# Patient Record
Sex: Male | Born: 1970 | Hispanic: No | Marital: Married | Smoking: Never smoker
Health system: Southern US, Community
[De-identification: ages and names within clinical notes are randomized; demographics above are authoritative.]

## PROBLEM LIST (undated history)

## (undated) HISTORY — PX: THYROIDECTOMY: SHX17

---

## 2013-07-30 ENCOUNTER — Encounter (HOSPITAL_COMMUNITY): Payer: Self-pay | Admitting: Emergency Medicine

## 2013-07-30 ENCOUNTER — Emergency Department (HOSPITAL_COMMUNITY)
Admission: EM | Admit: 2013-07-30 | Discharge: 2013-07-30 | Disposition: A | Discharge status: Home / Self Care | Payer: Self-pay | Attending: Emergency Medicine | Admitting: Emergency Medicine

## 2013-07-30 ENCOUNTER — Emergency Department (HOSPITAL_COMMUNITY): Payer: Self-pay

## 2013-07-30 DIAGNOSIS — N201 Calculus of ureter: Secondary | ICD-10-CM | POA: Insufficient documentation

## 2013-07-30 LAB — CBC WITH DIFFERENTIAL/PLATELET
BASOS ABS: 0.1 10*3/uL (ref 0.0–0.1)
Basophils Relative: 1 % (ref 0–1)
EOS ABS: 0.2 10*3/uL (ref 0.0–0.7)
EOS PCT: 3 % (ref 0–5)
HCT: 41.3 % (ref 39.0–52.0)
Hemoglobin: 14.3 g/dL (ref 13.0–17.0)
LYMPHS PCT: 38 % (ref 12–46)
Lymphs Abs: 3.1 10*3/uL (ref 0.7–4.0)
MCH: 29.1 pg (ref 26.0–34.0)
MCHC: 34.6 g/dL (ref 30.0–36.0)
MCV: 84.1 fL (ref 78.0–100.0)
Monocytes Absolute: 0.6 10*3/uL (ref 0.1–1.0)
Monocytes Relative: 8 % (ref 3–12)
Neutro Abs: 4.1 10*3/uL (ref 1.7–7.7)
Neutrophils Relative %: 50 % (ref 43–77)
Platelets: 281 10*3/uL (ref 150–400)
RBC: 4.91 MIL/uL (ref 4.22–5.81)
RDW: 12.8 % (ref 11.5–15.5)
WBC: 8.1 10*3/uL (ref 4.0–10.5)

## 2013-07-30 LAB — URINALYSIS, ROUTINE W REFLEX MICROSCOPIC
Bilirubin Urine: NEGATIVE
Glucose, UA: NEGATIVE mg/dL
Ketones, ur: NEGATIVE mg/dL
LEUKOCYTES UA: NEGATIVE
Nitrite: NEGATIVE
PROTEIN: NEGATIVE mg/dL
Specific Gravity, Urine: 1.015 (ref 1.005–1.030)
Urobilinogen, UA: 0.2 mg/dL (ref 0.0–1.0)
pH: 7 (ref 5.0–8.0)

## 2013-07-30 LAB — COMPREHENSIVE METABOLIC PANEL
ALT: 22 U/L (ref 0–53)
AST: 39 U/L — AB (ref 0–37)
Albumin: 4.7 g/dL (ref 3.5–5.2)
Alkaline Phosphatase: 73 U/L (ref 39–117)
BUN: 14 mg/dL (ref 6–23)
CALCIUM: 9.8 mg/dL (ref 8.4–10.5)
CO2: 18 meq/L — AB (ref 19–32)
Chloride: 96 mEq/L (ref 96–112)
Creatinine, Ser: 0.86 mg/dL (ref 0.50–1.35)
GFR calc Af Amer: >90 mL/min (ref >90)
GFR calc non Af Amer: >90 mL/min (ref >90)
Glucose, Bld: 153 mg/dL — ABNORMAL HIGH (ref 70–99)
Potassium: 4 mEq/L (ref 3.7–5.3)
Sodium: 138 mEq/L (ref 137–147)
TOTAL PROTEIN: 8 g/dL (ref 6.0–8.3)
Total Bilirubin: 0.5 mg/dL (ref 0.3–1.2)

## 2013-07-30 LAB — URINE MICROSCOPIC-ADD ON

## 2013-07-30 MED ORDER — PROMETHAZINE HCL 25 MG PO TABS: 25.0000 mg | ORAL_TABLET | Freq: Three times a day (TID) | ORAL | Status: AC | PRN

## 2013-07-30 MED ORDER — PERCOCET 5-325 MG PO TABS: 1.0000 | ORAL_TABLET | Freq: Four times a day (QID) | ORAL | Status: AC | PRN

## 2013-07-30 MED ORDER — HYDROMORPHONE HCL PF 1 MG/ML IJ SOLN
1.0000 mg | Freq: Once | INTRAMUSCULAR | Status: AC
  Administered 2013-07-30: 1 mg via INTRAVENOUS
  Filled 2013-07-30: qty 1

## 2013-07-30 MED ORDER — ONDANSETRON HCL 4 MG/2ML IJ SOLN
4.0000 mg | Freq: Once | INTRAMUSCULAR | Status: AC
  Administered 2013-07-30: 4 mg via INTRAVENOUS
  Filled 2013-07-30: qty 2

## 2013-07-30 MED ORDER — KETOROLAC TROMETHAMINE 30 MG/ML IJ SOLN
30.0000 mg | Freq: Once | INTRAMUSCULAR | Status: AC
  Administered 2013-07-30: 30 mg via INTRAVENOUS
  Filled 2013-07-30: qty 1

## 2013-07-30 MED ORDER — SODIUM CHLORIDE 0.9 % IV BOLUS (SEPSIS)
1000.0000 mL | Freq: Once | INTRAVENOUS | Status: AC
  Administered 2013-07-30: 1000 mL via INTRAVENOUS

## 2013-07-30 MED ORDER — TAMSULOSIN HCL 0.4 MG PO CAPS: 0.4000 mg | ORAL_CAPSULE | Freq: Every day | ORAL | Status: AC

## 2013-07-30 NOTE — Progress Notes (Signed)
[facility] provided pt with a list of primary care resources and a [facility] Orange Card application to help patient establish primary care.  °

## 2013-07-30 NOTE — ED Provider Notes (Signed)
CSN: [id number]     Arrival date & time 07/30/13  [id number] History   First MD Initiated Contact with Patient 07/30/13 [phone]     Chief Complaint  Patient presents with  . Flank Pain     (Consider location/radiation/quality/duration/timing/severity/associated sxs/prior Treatment) HPI Patient presents to the emergency department with sudden onset of right flank pain at 4 am.  The patient, states, that he has never had a history of kidney stones.  The patient denies taking any medications prior to arrival.  The patient denies chest pain, shortness breath, vomiting, diarrhea abdominal distention, weakness, dizziness, fever, rash, or syncope.  The patient, states, that he is having a little bit of difficulty urinating.  Patient, states, that seems to make his condition, better or worse History reviewed. No pertinent past medical history. Past Surgical History  Procedure Laterality Date  . Thyroidectomy     No family history on file. History  Substance Use Topics  . Smoking status: Never Smoker   . Smokeless tobacco: Not on file  . Alcohol Use: No    Review of Systems All other systems negative except as documented in the HPI. All pertinent positives and negatives as reviewed in the HPI.    Allergies  Review of patient's allergies indicates no known allergies.  Home Medications   Prior to Admission medications   Not on File   BP 143/82  Pulse 82  Temp(Src) 98.4 F (36.9 C) (Oral)  Resp 20  SpO2 100% Physical Exam  Nursing note and vitals reviewed. Constitutional: He is oriented to person, place, and time. He appears well-developed and well-nourished.  HENT:  Head: Normocephalic and atraumatic.  Mouth/Throat: Oropharynx is clear and moist.  Eyes: Pupils are equal, round, and reactive to light.  Neck: Normal range of motion. Neck supple.  Cardiovascular: Normal rate, regular rhythm and normal heart sounds.  Exam reveals no gallop and no friction rub.   No murmur  heard. Pulmonary/Chest: Effort normal and breath sounds normal. No respiratory distress.  Abdominal: Soft. Bowel sounds are normal. He exhibits no distension. There is tenderness. There is no rebound and no guarding.  Neurological: He is alert and oriented to person, place, and time. He exhibits normal muscle tone. Coordination normal.  Skin: Skin is warm and dry.    ED Course  Procedures (including critical care time) Labs Review Labs Reviewed  URINALYSIS, ROUTINE W REFLEX MICROSCOPIC - Abnormal; Notable for the following:    Hgb urine dipstick MODERATE (*)    All other components within normal limits  COMPREHENSIVE METABOLIC PANEL - Abnormal; Notable for the following:    CO2 18 (*)    Glucose, Bld 153 (*)    AST 39 (*)    All other components within normal limits  CBC WITH DIFFERENTIAL  URINE MICROSCOPIC-ADD ON    Imaging Review Ct Abdomen Pelvis Wo Contrast  07/30/2013   CLINICAL DATA:  Right flank pain following  EXAM: CT ABDOMEN AND PELVIS WITHOUT CONTRAST  TECHNIQUE: Multidetector CT imaging of the abdomen and pelvis was performed following the standard protocol without intravenous contrast.  COMPARISON:  None.  FINDINGS: Lung bases are unremarkable. Unenhanced liver, pancreas, spleen and adrenal glands are unremarkable. Unenhanced kidneys shows no nephrolithiasis. There is very mild right hydronephrosis and minimal distension of the right ureter. Subtle my right perinephric stranding. No aortic aneurysm. No small bowel obstruction. No pericecal inflammation. The terminal ileum is unremarkable.  In axial image 77 there is poorly visualized calculus in distal right ureter about  5 mm from right UVJ measures 2 mm. This is confirmed on coronal image 56. Distal left ureter is unremarkable. Urinary bladder is under distended without evidence of calcified calculi. No ascites or free air. No adenopathy.  Sagittal images of the spine are unremarkable.  IMPRESSION: 1. There is mild right  hydronephrosis and minimal distension of right ureter. Poorly visualized 2 mm calculus in distal right ureter about 5 mm from right UVJ. 2. No nephrolithiasis. 3. Unremarkable left ureter. No small bowel obstruction. No ascites or free air. 4. No pericecal inflammation.   Electronically Signed   By: Natasha MeadLiviu  Pop M.D.   On: 07/30/2013 08:38   Patient be treated for a 2 mm right UVJ stone.  Patient, at this time is feeling better on reassessment, and is pain-free.  The patient is advised of the plan.  All questions were answered.  The patient  voices an understanding.  Told to return here for any worsening of pain, or difficulty urinating or fever    Carlyle DollyChristopher W Briston Lax, PA-C 07/30/13 1108

## 2013-07-30 NOTE — Discharge Instructions (Signed)
Return here for any worsening in your condition. Follow up with the Urologist provided. Increase your fluid intake.

## 2013-07-30 NOTE — ED Notes (Signed)
Pt sleeping when walked into room. Reminded pt we still needed a urine specimen, left urinal at bedside, and instructed to call out as soon as he was able too.

## 2013-07-30 NOTE — ED Notes (Signed)
Pt unable to urinate.

## 2013-07-30 NOTE — ED Provider Notes (Signed)
Medical screening examination/treatment/procedure(s) were conducted as a shared visit with non-physician practitioner(s) and myself.  I personally evaluated the patient during the encounter.   EKG Interpretation None      I interviewed and examined the patient. Lungs are CTAB. Cardiac exam wnl. Abdomen soft.    Junius ArgyleForrest S Braeden Kennan, MD 07/30/13 2107

## 2013-07-30 NOTE — ED Notes (Signed)
Pt c/o right flank pain x 2 hours; nauseated

## 2014-11-19 ENCOUNTER — Emergency Department (HOSPITAL_COMMUNITY)
Admission: EM | Admit: 2014-11-19 | Discharge: 2014-11-19 | Disposition: A | Discharge status: Home / Self Care | Payer: Self-pay | Attending: Emergency Medicine | Admitting: Emergency Medicine

## 2014-11-19 ENCOUNTER — Encounter (HOSPITAL_COMMUNITY): Payer: Self-pay | Admitting: Emergency Medicine

## 2014-11-19 DIAGNOSIS — K279 Peptic ulcer, site unspecified, unspecified as acute or chronic, without hemorrhage or perforation: Secondary | ICD-10-CM | POA: Insufficient documentation

## 2014-11-19 DIAGNOSIS — R109 Unspecified abdominal pain: Secondary | ICD-10-CM

## 2014-11-19 LAB — CBC WITH DIFFERENTIAL/PLATELET
BASOS PCT: 0 % (ref 0–1)
Basophils Absolute: 0 10*3/uL (ref 0.0–0.1)
EOS ABS: 0.3 10*3/uL (ref 0.0–0.7)
EOS PCT: 4 % (ref 0–5)
HCT: 41.5 % (ref 39.0–52.0)
HEMOGLOBIN: 14.4 g/dL (ref 13.0–17.0)
LYMPHS ABS: 2.5 10*3/uL (ref 0.7–4.0)
Lymphocytes Relative: 35 % (ref 12–46)
MCH: 29.6 pg (ref 26.0–34.0)
MCHC: 34.7 g/dL (ref 30.0–36.0)
MCV: 85.2 fL (ref 78.0–100.0)
MONO ABS: 0.5 10*3/uL (ref 0.1–1.0)
MONOS PCT: 6 % (ref 3–12)
Neutro Abs: 3.9 10*3/uL (ref 1.7–7.7)
Neutrophils Relative %: 55 % (ref 43–77)
Platelets: 277 10*3/uL (ref 150–400)
RBC: 4.87 MIL/uL (ref 4.22–5.81)
RDW: 13.3 % (ref 11.5–15.5)
WBC: 7 10*3/uL (ref 4.0–10.5)

## 2014-11-19 LAB — URINALYSIS W MICROSCOPIC (NOT AT ARMC)
Bilirubin Urine: NEGATIVE
Glucose, UA: NEGATIVE mg/dL
HGB URINE DIPSTICK: NEGATIVE
Ketones, ur: NEGATIVE mg/dL
LEUKOCYTES UA: NEGATIVE
Nitrite: NEGATIVE
PH: 5.5 (ref 5.0–8.0)
PROTEIN: NEGATIVE mg/dL
Specific Gravity, Urine: 1.013 (ref 1.005–1.030)
Urobilinogen, UA: 0.2 mg/dL (ref 0.0–1.0)

## 2014-11-19 LAB — BASIC METABOLIC PANEL
Anion gap: 9 (ref 5–15)
BUN: 10 mg/dL (ref 6–20)
CO2: 26 mmol/L (ref 22–32)
Calcium: 9.4 mg/dL (ref 8.9–10.3)
Chloride: 102 mmol/L (ref 101–111)
Creatinine, Ser: 0.91 mg/dL (ref 0.61–1.24)
GFR calc Af Amer: >60 mL/min (ref >60)
Glucose, Bld: 106 mg/dL — ABNORMAL HIGH (ref 65–99)
POTASSIUM: 4.1 mmol/L (ref 3.5–5.1)
SODIUM: 137 mmol/L (ref 135–145)

## 2014-11-19 LAB — HEPATIC FUNCTION PANEL
ALT: 29 U/L (ref 17–63)
AST: 42 U/L — ABNORMAL HIGH (ref 15–41)
Albumin: 4.5 g/dL (ref 3.5–5.0)
Alkaline Phosphatase: 72 U/L (ref 38–126)
BILIRUBIN DIRECT: 0.4 mg/dL (ref 0.1–0.5)
BILIRUBIN INDIRECT: 0.7 mg/dL (ref 0.3–0.9)
Total Bilirubin: 1.1 mg/dL (ref 0.3–1.2)
Total Protein: 7.7 g/dL (ref 6.5–8.1)

## 2014-11-19 LAB — LIPASE, BLOOD: Lipase: 22 U/L (ref 22–51)

## 2014-11-19 MED ORDER — FAMOTIDINE IN NACL 20-0.9 MG/50ML-% IV SOLN
20.0000 mg | Freq: Once | INTRAVENOUS | Status: AC
  Administered 2014-11-19: 20 mg via INTRAVENOUS
  Filled 2014-11-19: qty 50

## 2014-11-19 MED ORDER — OMEPRAZOLE 20 MG PO CPDR: 20.0000 mg | DELAYED_RELEASE_CAPSULE | Freq: Every day | ORAL | Status: DC

## 2014-11-19 NOTE — Discharge Instructions (Signed)
Abdominal Pain Many things can cause belly (abdominal) pain. Most times, the belly pain is not dangerous. Many cases of belly pain can be watched and treated at home. HOME CARE   Do not take medicines that help you go poop (laxatives) unless told to by your doctor.  Only take medicine as told by your doctor.  Eat or drink as told by your doctor. Your doctor will tell you if you should be on a special diet. GET HELP IF:  You do not know what is causing your belly pain.  You have belly pain while you are sick to your stomach (nauseous) or have runny poop (diarrhea).  You have pain while you pee or poop.  Your belly pain wakes you up at night.  You have belly pain that gets worse or better when you eat.  You have belly pain that gets worse when you eat fatty foods.  You have a fever. GET HELP RIGHT AWAY IF:   The pain does not go away within 2 hours.  You keep throwing up (vomiting).  The pain changes and is only in the right or left part of the belly.  You have bloody or tarry looking poop. MAKE SURE YOU:   Understand these instructions.  Will watch your condition.  Will get help right away if you are not doing well or get worse. Document Released: 09/12/2007 Document Revised: 03/31/2013 Document Reviewed: 12/03/2012 [facility] Patient Information 2015 [place], [place]. This information is not intended to replace advice given to you by your health care provider. Make sure you discuss any questions you have with your health care provider.  Peptic Ulcer A peptic ulcer is a painful sore in the lining of your esophagus, stomach, or in the first part of your small intestine. The main causes of an ulcer can be:  An infection.  Using certain pain medicines too often or too much.  Smoking. HOME CARE  Avoid smoking, alcohol, and caffeine.  Avoid foods that bother you.  Only take medicine as told by your doctor. Do not take any medicines your doctor has not approved.  Keep  all doctor visits as told. GET HELP IF:  You do not get better in 7 days after starting treatment.  You keep having an upset stomach (indigestion) or heartburn. GET HELP RIGHT AWAY IF:  You have sudden, sharp, or lasting belly (abdominal) pain.  You have bloody, black, or tarry poop (stool).  You throw up (vomit) blood or your throw up looks like coffee grounds.  You get light-headed, weak, or feel like you will pass out (faint).  You get sweaty or feel sticky and cold to the touch (clammy). MAKE SURE YOU:   Understand these instructions.  Will watch your condition.  Will get help right away if you are not doing well or get worse. Document Released: 06/20/2009 Document Revised: 08/10/2013 Document Reviewed: 10/24/2011 [facility] Patient Information 2015 [place], [place]. This information is not intended to replace advice given to you by your health care provider. Make sure you discuss any questions you have with your health care provider.

## 2014-11-19 NOTE — ED Notes (Signed)
Pt reports lower left quadrant pain x 2 weeks with some nausea, denies vomiting or diarrhea. Pt also reports pain in his left shoulder.

## 2014-11-19 NOTE — ED Notes (Signed)
MD at bedside. 

## 2014-11-19 NOTE — ED Provider Notes (Signed)
CSN: [id number]     Arrival date & time 11/19/14  1029 History   First MD Initiated Contact with Patient 11/19/14 1038     Chief Complaint  Patient presents with  . Abdominal Pain     (Consider location/radiation/quality/duration/timing/severity/associated sxs/prior Treatment) HPI  44 year old male, otherwise healthy with no prior abdominal surgeries, who presents to emergency department with abdominal pain. He reports 2 weeks of intermittent left-sided abdominal pain associated with eating. He notes that pain is worse when he drinks coffee. Pain usually resolves on its own in about an hour or so. It is sometimes associated with nausea but denies any vomiting, melena, hematochezia, weight loss, or early satiety. He has not had any fevers, chills, diarrhea, dysuria, urinary frequency, hematuria, urinary symptoms, testicular symptoms.  History reviewed. No pertinent past medical history. Past Surgical History  Procedure Laterality Date  . Thyroidectomy     History reviewed. No pertinent family history. Social History  Substance Use Topics  . Smoking status: Never Smoker   . Smokeless tobacco: None  . Alcohol Use: No    Review of Systems 10/14 systems reviewed and are negative other than those stated in the HPI   Allergies  Review of patient's allergies indicates no known allergies.  Home Medications   Prior to Admission medications   Medication Sig Start Date End Date Taking? Authorizing Provider  omeprazole (PRILOSEC) 20 MG capsule Take 1 capsule (20 mg total) by mouth daily. 11/19/14   Lavera Guise, MD  PERCOCET 5-325 MG per tablet Take 1 tablet by mouth every 6 (six) hours as needed for severe pain. Patient not taking: Reported on 11/19/2014 07/30/13   Charlestine Night, PA-C  promethazine (PHENERGAN) 25 MG tablet Take 1 tablet (25 mg total) by mouth every 8 (eight) hours as needed for nausea or vomiting. Patient not taking: Reported on 11/19/2014 07/30/13   Charlestine Night,  PA-C  tamsulosin (FLOMAX) 0.4 MG CAPS capsule Take 1 capsule (0.4 mg total) by mouth daily. Patient not taking: Reported on 11/19/2014 07/30/13   Charlestine Night, PA-C   BP 126/84 mmHg  Pulse 67  Temp(Src) 98.1 F (36.7 C) (Oral)  Resp 12  SpO2 98% Physical Exam Physical Exam  Nursing note and vitals reviewed. Constitutional: Well developed, well nourished, non-toxic, and in no acute distress Head: Normocephalic and atraumatic.  Mouth/Throat: Oropharynx is clear and moist.  Neck: Normal range of motion. Neck supple.  Cardiovascular: Normal rate and regular rhythm.   Pulmonary/Chest: Effort normal and breath sounds normal.  Abdominal: Soft. There is no tenderness. There is no rebound and no guarding.  Musculoskeletal: Normal range of motion.  Neurological: Alert, no facial droop, fluent speech, moves all extremities symmetrically Skin: Skin is warm and dry.  Psychiatric: Cooperative  ED Course  Procedures (including critical care time) Labs Review Labs Reviewed  BASIC METABOLIC PANEL - Abnormal; Notable for the following:    Glucose, Bld 106 (*)    All other components within normal limits  HEPATIC FUNCTION PANEL - Abnormal; Notable for the following:    AST 42 (*)    All other components within normal limits  CBC WITH DIFFERENTIAL/PLATELET  URINALYSIS W MICROSCOPIC  LIPASE, BLOOD    Imaging Review No results found. I, Lavera Guise, personally reviewed and evaluated these images and lab results as part of my medical decision-making.   EKG Interpretation None      MDM   Final diagnoses:  Abdominal pain, unspecified abdominal location  Peptic ulcer  In short, this is a 44 year old male, otherwise healthy with no previous abdominal surgeries who presents with intermittent left-sided abdominal pain after eating and drinking coffee. He is nontoxic in no acute distress on arrival. His vital signs are within normal limits. He currently has no abdominal pain since  arrival. He has a soft and benign abdomen. Overall symptoms seem consistent with possible peptic ulcer disease. Basic blood work including CBC, comprehensive metabolic panel, lipase and urinalysis will be checked. He'll be given Pepcid here. Given normal abdominal exam, low suspicion for serious or toxic intraabdominal processes at this time.  He will be discharged home with a PPI and GI follow-up.    Lavera Guise, MD 11/19/14 [phone]

## 2015-04-29 IMAGING — CT CT ABD-PELV W/O CM
1 series · 10 of 12 positions shown, 16 images · non-contrast
Comparison: None.

CLINICAL DATA: Right flank pain following

EXAM:
CT ABDOMEN AND PELVIS WITHOUT CONTRAST
TECHNIQUE: Multidetector CT imaging of the abdomen and pelvis was performed
following the standard protocol without intravenous contrast.

[Series 6: lung · axial · 0.78mm/px · z∈[-75,-30]mm · 10 of 12 slices shown, 16 images]
[im 2/12  soft-tissue]
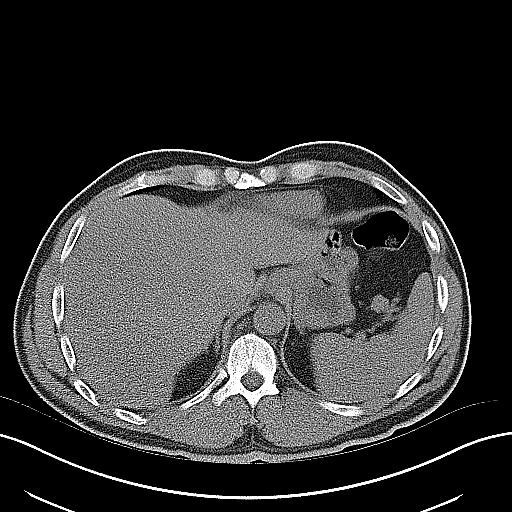
[im 2/12  bone]
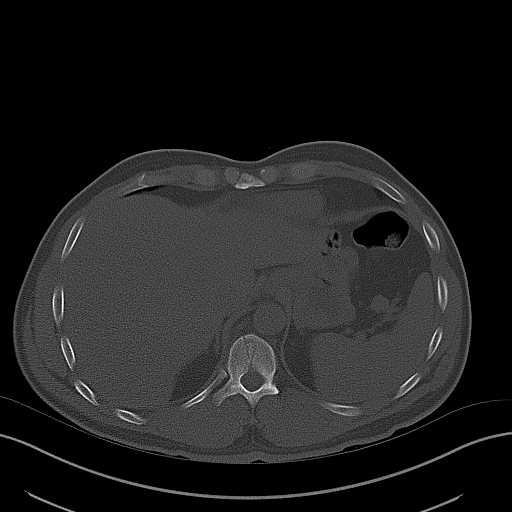
[im 3/12  soft-tissue]
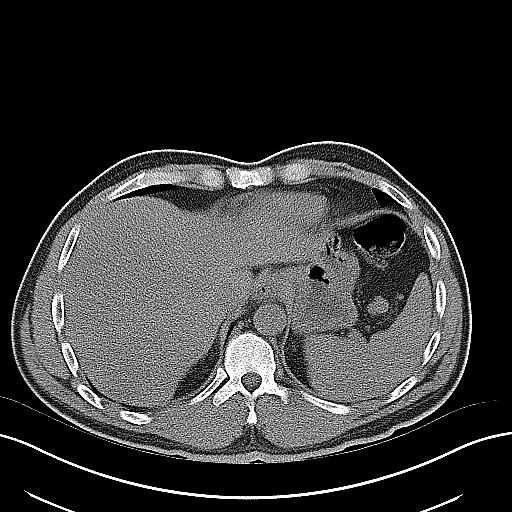
[im 4/12  soft-tissue]
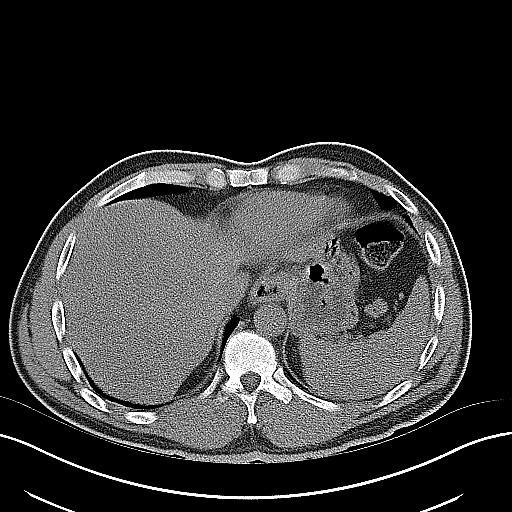
[im 5/12  soft-tissue]
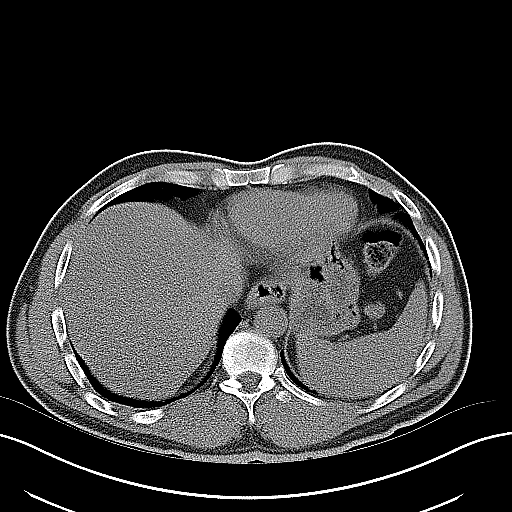
[im 6/12  soft-tissue]
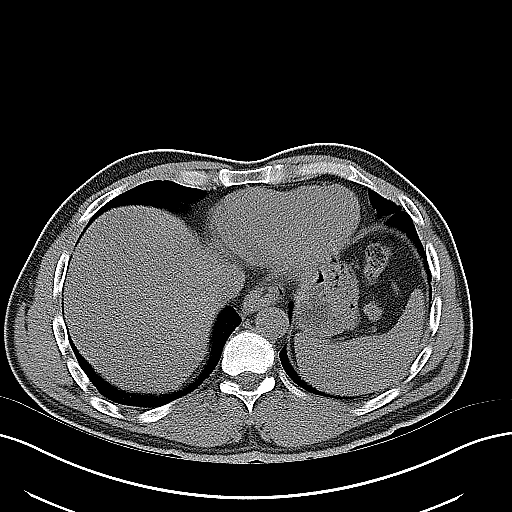
[im 7/12  soft-tissue]
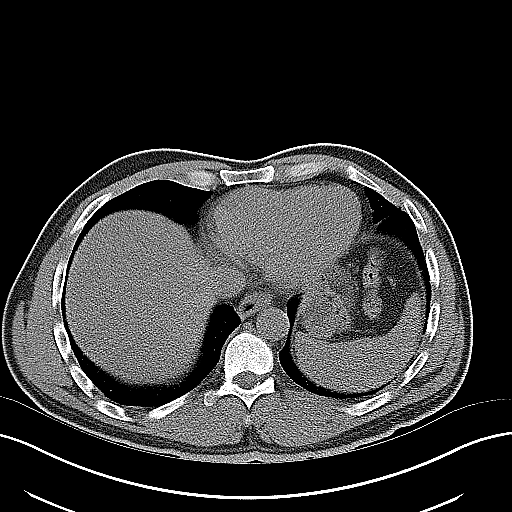
[im 8/12  soft-tissue]
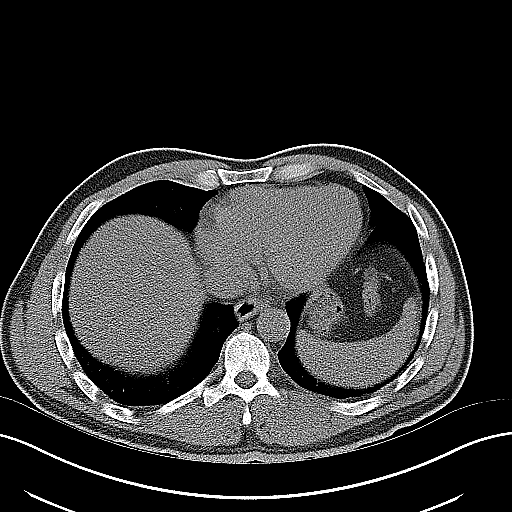
[im 8/12  lung]
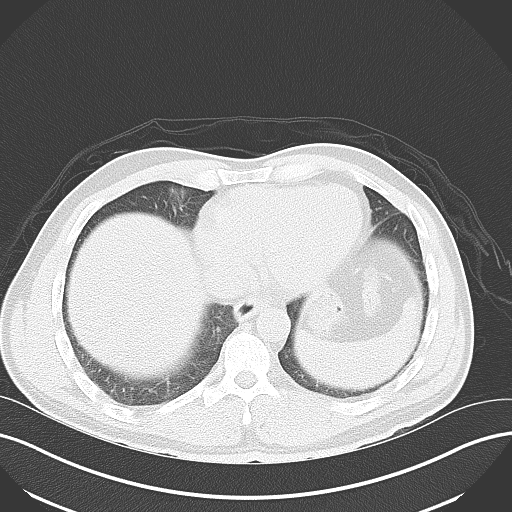
[im 9/12  soft-tissue]
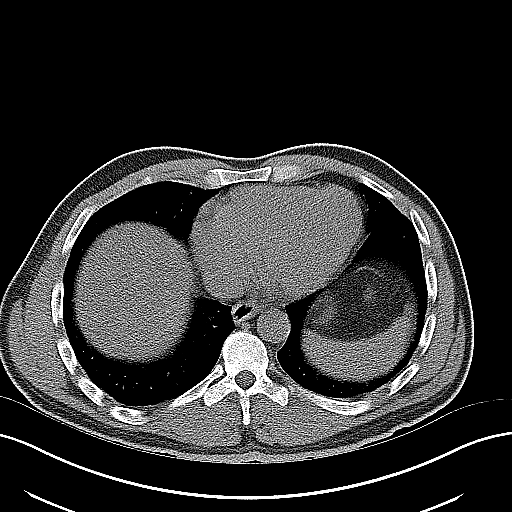
[im 9/12  lung]
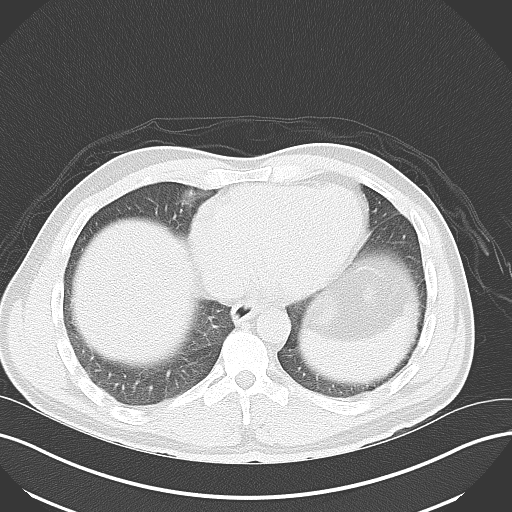
[im 10/12  soft-tissue]
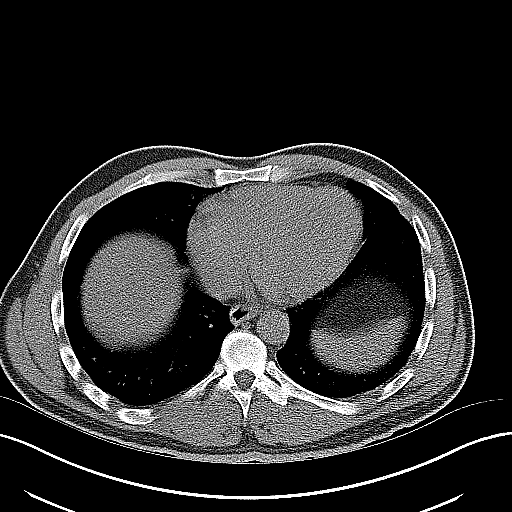
[im 10/12  lung]
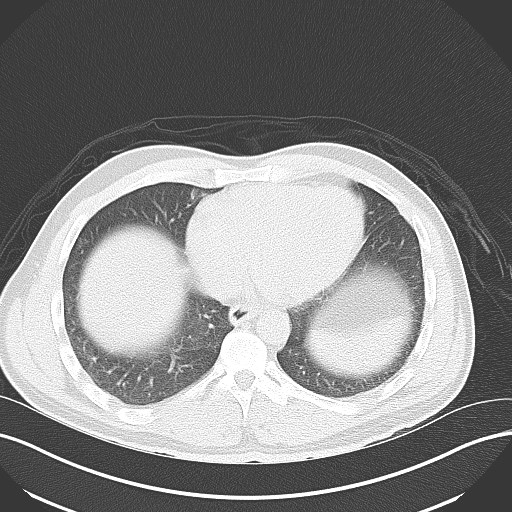
[im 10/12  bone]
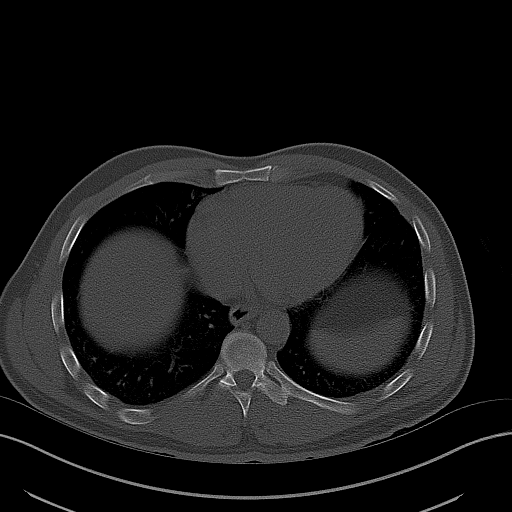
[im 11/12  soft-tissue]
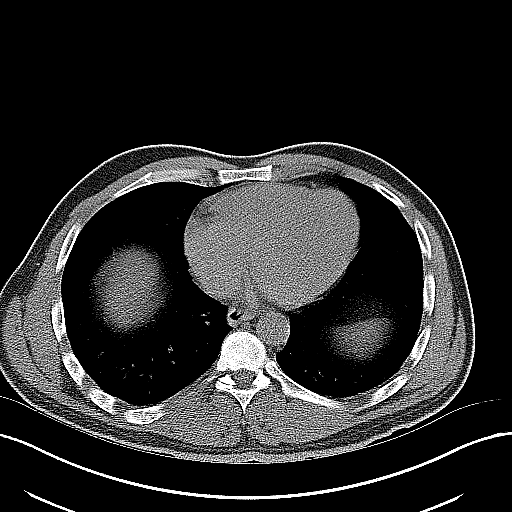
[im 11/12  lung]
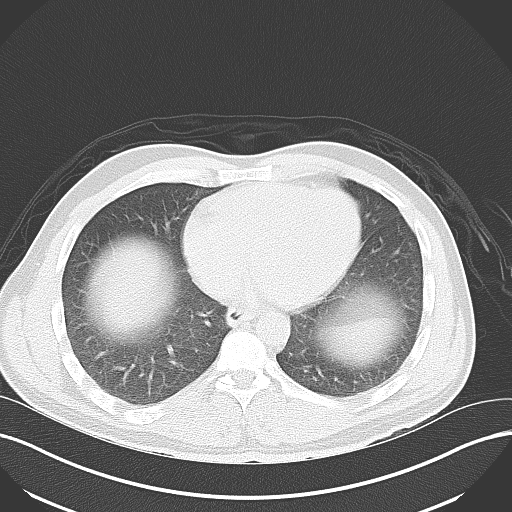

[10 of 12 positions shown; findings below may reference images not displayed]

FINDINGS: Lung bases are unremarkable. Unenhanced liver, pancreas, spleen and
adrenal glands are unremarkable. Unenhanced kidneys shows no
nephrolithiasis. There is very mild right hydronephrosis and minimal
distension of the right ureter. Subtle my right perinephric
stranding. No aortic aneurysm. No small bowel obstruction. No
pericecal inflammation. The terminal ileum is unremarkable.

In axial image 77 there is poorly visualized calculus in distal
right ureter about 5 mm from right UVJ measures 2 mm. This is
confirmed on coronal image 56. Distal left ureter is unremarkable.
Urinary bladder is under distended without evidence of calcified
calculi. No ascites or free air. No adenopathy.

Sagittal images of the spine are unremarkable.
IMPRESSION: 1. There is mild right hydronephrosis and minimal distension of
right ureter. Poorly visualized 2 mm calculus in distal right ureter
about 5 mm from right UVJ.
2. No nephrolithiasis.
3. Unremarkable left ureter. No small bowel obstruction. No ascites
or free air.
4. No pericecal inflammation.

## 2015-11-21 ENCOUNTER — Ambulatory Visit: Payer: Self-pay

## 2015-11-21 ENCOUNTER — Other Ambulatory Visit: Payer: Self-pay | Admitting: Occupational Medicine

## 2015-11-21 DIAGNOSIS — M79631 Pain in right forearm: Secondary | ICD-10-CM

## 2015-12-13 ENCOUNTER — Emergency Department (HOSPITAL_COMMUNITY)
Admission: EM | Admit: 2015-12-13 | Discharge: 2015-12-14 | Disposition: A | Discharge status: Home / Self Care | Payer: 59 | Attending: Emergency Medicine | Admitting: Emergency Medicine

## 2015-12-13 ENCOUNTER — Encounter (HOSPITAL_COMMUNITY): Payer: Self-pay | Admitting: *Deleted

## 2015-12-13 DIAGNOSIS — H5713 Ocular pain, bilateral: Secondary | ICD-10-CM | POA: Diagnosis present

## 2015-12-13 DIAGNOSIS — H16133 Photokeratitis, bilateral: Secondary | ICD-10-CM | POA: Diagnosis not present

## 2015-12-13 MED ORDER — TETRACAINE HCL 0.5 % OP SOLN
1.0000 [drp] | Freq: Once | OPHTHALMIC | Status: AC
  Administered 2015-12-13: 1 [drp] via OPHTHALMIC
  Filled 2015-12-13: qty 2

## 2015-12-13 MED ORDER — ERYTHROMYCIN 5 MG/GM OP OINT: TOPICAL_OINTMENT | OPHTHALMIC | 0 refills | Status: DC

## 2015-12-13 MED ORDER — FLUORESCEIN SODIUM 1 MG OP STRP
2.0000 | ORAL_STRIP | Freq: Once | OPHTHALMIC | Status: AC
  Administered 2015-12-13: 2 via OPHTHALMIC
  Filled 2015-12-13: qty 2

## 2015-12-13 NOTE — Discharge Instructions (Signed)
Follow up with ophthalmology tomorrow or as soon as possible. Use antibiotic ointment as instructed.

## 2015-12-13 NOTE — ED Provider Notes (Signed)
[facility]-EMERGENCY DEPT Provider Note   CSN: [id number] Arrival date & time: 12/13/15  2301  By signing my name below, I, Bridgette Habermann, attest that this documentation has been prepared under the direction and in the presence of Chadwin Fury, [place]. Electronically Signed: Bridgette Habermann, ED Scribe. 12/13/15. 11:30 PM.  History   Chief Complaint Chief Complaint  Patient presents with  . Eye Problem   HPI Comments: Ricky Huber is a 45 y.o. male who presents to the Emergency Department complaining of gradual onset, constant, burning, bilateral eye pain with redness onset 4 pm today. Pt also has associated blurry vision and copious eye tearing. Denies foreign body sensation. Pt works in Production designer, theatre/television/film and states he was working next to [employer] earlier today. States he was wearing clear safety goggles but no UV specific eyewear. He denies any modifying factors. Pt denies any exposure to chemicals. No treatments tried. Pt wears contacts. Pt endorses photophobia with direct light, though not to ambient light.  The history is provided by the patient. No language interpreter was used.    History reviewed. No pertinent past medical history.  There are no active problems to display for this patient.   Past Surgical History:  Procedure Laterality Date  . THYROIDECTOMY         Home Medications    Prior to Admission medications   Medication Sig Start Date End Date Taking? Authorizing Provider  omeprazole (PRILOSEC) 20 MG capsule Take 1 capsule (20 mg total) by mouth daily. 11/19/14   Lavera Guise, MD  PERCOCET 5-325 MG per tablet Take 1 tablet by mouth every 6 (six) hours as needed for severe pain. Patient not taking: Reported on 11/19/2014 07/30/13   Charlestine Night, PA-C  promethazine (PHENERGAN) 25 MG tablet Take 1 tablet (25 mg total) by mouth every 8 (eight) hours as needed for nausea or vomiting. Patient not taking: Reported on 11/19/2014 07/30/13   Charlestine Night, PA-C  tamsulosin (FLOMAX) 0.4 MG  CAPS capsule Take 1 capsule (0.4 mg total) by mouth daily. Patient not taking: Reported on 11/19/2014 07/30/13   Charlestine Night, PA-C    Family History History reviewed. No pertinent family history.  Social History Social History  Substance Use Topics  . Smoking status: Never Smoker  . Smokeless tobacco: Never Used  . Alcohol use No     Allergies   Review of patient's allergies indicates no known allergies.   Review of Systems Review of Systems 10 Systems reviewed and all are negative for acute change except as noted in the HPI. Physical Exam Updated Vital Signs BP 148/72 (BP Location: Left Arm)   Pulse 77   Temp 98.2 F (36.8 C) (Oral)   Resp 20   Ht 6' (1.829 m)   Wt 193 lb (87.5 kg)   SpO2 98%   BMI 26.18 kg/m   Physical Exam  Constitutional: He appears well-developed and well-nourished.  HENT:  Head: Normocephalic.  Eyes: Conjunctivae and EOM are normal. Pupils are equal, round, and reactive to light.  Bilateral conjunctival injection, R>L. No fluorescence uptake on exam. Bilateral copious eye watering.  Cardiovascular: Normal rate.   Pulmonary/Chest: Effort normal. No respiratory distress.  Abdominal: He exhibits no distension.  Musculoskeletal: Normal range of motion.  Neurological: He is alert.  Skin: Skin is warm and dry.  Psychiatric: He has a normal mood and affect. His behavior is normal.  Nursing note and vitals reviewed.   Visual Acuity  Right Eye Distance:   Left Eye Distance:  Bilateral Distance:    Right Eye Near:   Left Eye Near:    Bilateral Near:      ED Treatments / Results  DIAGNOSTIC STUDIES: Oxygen Saturation is 98% on RA, normal by my interpretation.    COORDINATION OF CARE: 11:30 PM Discussed treatment plan with pt at bedside and pt agreed to plan.  Labs (all labs ordered are listed, but only abnormal results are displayed) Labs Reviewed - No data to display  EKG  EKG Interpretation None       Radiology No  results found.  Procedures Procedures (including critical care time)  Medications Ordered in ED Medications - No data to display   Initial Impression / Assessment and Plan / ED Course  I have reviewed the triage vital signs and the nursing notes.  Pertinent labs & imaging results that were available during my care of the patient were reviewed by me and considered in my medical decision making (see chart for details).  Clinical Course    Suspect UV keratitis. Will cover with course of erythromycin ointment. Instructed close f/u with ophthalmology tomorrow. Encouraged wearing UV protective eyewear in the future. ER return precautions given.  Final Clinical Impressions(s) / ED Diagnoses   Final diagnoses:  UV keratitis, bilateral    New Prescriptions Discharge Medication List as of 12/13/2015 11:52 PM    START taking these medications   Details  erythromycin ophthalmic ointment Place a 1/2 inch ribbon of ointment into the lower eyelid three times daily, Print      I personally performed the services described in this documentation, which was scribed in my presence. The recorded information has been reviewed and is accurate.    Carlene CoriaSerena Y Davonna Ertl, PA-C 12/14/15 1520    Mancel BaleElliott Wentz, MD 12/14/15 [phone]

## 2015-12-13 NOTE — ED Triage Notes (Signed)
Pt c/o bilateral eye pain and redness. Pt presents with burning to both eyes and some blurry vision. Pt states onset occurred around 1600 today. Pt works in maintenance. Pt denies any exposure to chemicals.

## 2017-03-07 ENCOUNTER — Emergency Department (HOSPITAL_COMMUNITY): Payer: 59

## 2017-03-07 ENCOUNTER — Encounter (HOSPITAL_COMMUNITY): Payer: Self-pay

## 2017-03-07 ENCOUNTER — Emergency Department (HOSPITAL_COMMUNITY)
Admission: EM | Admit: 2017-03-07 | Discharge: 2017-03-07 | Disposition: A | Discharge status: Home / Self Care | Payer: 59 | Attending: Emergency Medicine | Admitting: Emergency Medicine

## 2017-03-07 DIAGNOSIS — Z79899 Other long term (current) drug therapy: Secondary | ICD-10-CM | POA: Diagnosis not present

## 2017-03-07 DIAGNOSIS — R0789 Other chest pain: Secondary | ICD-10-CM | POA: Insufficient documentation

## 2017-03-07 DIAGNOSIS — F419 Anxiety disorder, unspecified: Secondary | ICD-10-CM | POA: Diagnosis not present

## 2017-03-07 LAB — I-STAT TROPONIN, ED
TROPONIN I, POC: 0 ng/mL (ref 0.00–0.08)
TROPONIN I, POC: 0 ng/mL (ref 0.00–0.08)
Troponin i, poc: 0 ng/mL (ref 0.00–0.08)

## 2017-03-07 LAB — CBC
HEMATOCRIT: 47.8 % (ref 39.0–52.0)
Hemoglobin: 17 g/dL (ref 13.0–17.0)
MCH: 30.1 pg (ref 26.0–34.0)
MCHC: 35.6 g/dL (ref 30.0–36.0)
MCV: 84.8 fL (ref 78.0–100.0)
Platelets: 284 10*3/uL (ref 150–400)
RBC: 5.64 MIL/uL (ref 4.22–5.81)
RDW: 13 % (ref 11.5–15.5)
WBC: 8.2 10*3/uL (ref 4.0–10.5)

## 2017-03-07 LAB — BASIC METABOLIC PANEL
Anion gap: 8 (ref 5–15)
BUN: 10 mg/dL (ref 6–20)
CHLORIDE: 100 mmol/L — AB (ref 101–111)
CO2: 29 mmol/L (ref 22–32)
CREATININE: 0.82 mg/dL (ref 0.61–1.24)
Calcium: 10 mg/dL (ref 8.9–10.3)
GFR calc Af Amer: >60 mL/min (ref >60)
GFR calc non Af Amer: >60 mL/min (ref >60)
Glucose, Bld: 100 mg/dL — ABNORMAL HIGH (ref 65–99)
POTASSIUM: 3.3 mmol/L — AB (ref 3.5–5.1)
SODIUM: 137 mmol/L (ref 135–145)

## 2017-03-07 LAB — D-DIMER, QUANTITATIVE (NOT AT ARMC)

## 2017-03-07 NOTE — ED Triage Notes (Signed)
Patient complains of left anterior chest tightness x 2 days. No associated symptoms. Mild pain on arrival. Alert and oriented, NAD

## 2017-03-07 NOTE — ED Provider Notes (Signed)
Ricky Huber Provider Note   CSN: [id number] Arrival date & time: 03/07/17  [id number]     History   Chief Complaint Chief Complaint  Patient presents with  . Chest Pain    HPI Ricky Huber is a 46 y.o. male.  46yo M who p/w chest pain.  2 days ago, the patient began had it being intermittent left anterior chest tightness that has not been associated with exertion.  No associated shortness of breath, nausea, vomiting, or diaphoresis.  Nothing seems to make the symptoms better or worse.  He denies any cough, fevers, or recent illness.  No recent travel, leg swelling, or history of blood clots.  He denies any family history of heart disease.  No medications prior to arrival. No tobacco or cocaine use.   The history is provided by the patient.  Chest Pain      History reviewed. No pertinent past medical history.  There are no active problems to display for this patient.   Past Surgical History:  Procedure Laterality Date  . THYROIDECTOMY         Home Medications    Prior to Admission medications   Medication Sig Start Date End Date Taking? Authorizing Provider  erythromycin ophthalmic ointment Place a 1/2 inch ribbon of ointment into the lower eyelid three times daily 12/13/15   Sam, Serena Y, PA-C  omeprazole (PRILOSEC) 20 MG capsule Take 1 capsule (20 mg total) by mouth daily. 11/19/14   Lavera GuiseLiu, Dana Duo, MD  PERCOCET 5-325 MG per tablet Take 1 tablet by mouth every 6 (six) hours as needed for severe pain. Patient not taking: Reported on 11/19/2014 07/30/13   Charlestine NightLawyer, Christopher, PA-C  promethazine (PHENERGAN) 25 MG tablet Take 1 tablet (25 mg total) by mouth every 8 (eight) hours as needed for nausea or vomiting. Patient not taking: Reported on 11/19/2014 07/30/13   Charlestine NightLawyer, Christopher, PA-C  tamsulosin (FLOMAX) 0.4 MG CAPS capsule Take 1 capsule (0.4 mg total) by mouth daily. Patient not taking: Reported on 11/19/2014 07/30/13   Charlestine NightLawyer,  Christopher, PA-C    Family History No family history on file.  Social History Social History   Tobacco Use  . Smoking status: Never Smoker  . Smokeless tobacco: Never Used  Substance Use Topics  . Alcohol use: No  . Drug use: No     Allergies   Patient has no known allergies.   Review of Systems Review of Systems  Cardiovascular: Positive for chest pain.   All other systems reviewed and are negative except that which was mentioned in HPI   Physical Exam Updated Vital Signs BP (!) 129/94   Pulse 61   Temp 97.7 F (36.5 C) (Oral)   Resp 10   SpO2 100%   Physical Exam  Constitutional: He is oriented to person, place, and time. He appears well-developed and well-nourished. No distress.  HENT:  Head: Normocephalic and atraumatic.  Moist mucous membranes  Eyes: Conjunctivae are normal. Pupils are equal, round, and reactive to light.  Neck: Neck supple.  Cardiovascular: Normal rate, regular rhythm and normal heart sounds.  No murmur heard. Pulmonary/Chest: Effort normal and breath sounds normal.  Abdominal: Soft. Bowel sounds are normal. He exhibits no distension. There is no tenderness.  Musculoskeletal: He exhibits no edema.  Neurological: He is alert and oriented to person, place, and time.  Fluent speech  Skin: Skin is warm and dry.  Psychiatric: Judgment normal. His mood appears anxious (mild).  Nursing note and  vitals reviewed.    ED Treatments / Results  Labs (all labs ordered are listed, but only abnormal results are displayed) Labs Reviewed  BASIC METABOLIC PANEL - Abnormal; Notable for the following components:      Result Value   Potassium 3.3 (*)    Chloride 100 (*)    Glucose, Bld 100 (*)    All other components within normal limits  CBC  D-DIMER, QUANTITATIVE (NOT AT [facility])  I-STAT TROPONIN, ED  I-STAT TROPONIN, ED  I-STAT TROPONIN, ED    EKG  EKG Interpretation  Date/Time:  Thursday March 07 2017 08:40:50 EST Ventricular Rate:    83 PR Interval:  144 QRS Duration: 78 QT Interval:  362 QTC Calculation: 425 R Axis:   41 Text Interpretation:  Normal sinus rhythm Nonspecific T wave abnormality Abnormal ECG inferior T wave inversions with lateral flattening, no previous tracing for comparison Confirmed by Ricky Huber [phone]([id number]) on 03/07/2017 9:12:42 AM       Radiology Dg Chest 2 View  Result Date: 03/07/2017 CLINICAL DATA:  Chest pain EXAM: CHEST  2 VIEW COMPARISON:  None. FINDINGS: Heart and mediastinal contours are within normal limits. No focal opacities or effusions. No acute bony abnormality. IMPRESSION: No active cardiopulmonary disease. Electronically Signed   By: Ricky NoseKevin  Dover M.D.   On: 03/07/2017 09:44    Procedures Procedures (including critical care time)  Medications Ordered in ED Medications - No data to display   Initial Impression / Assessment and Plan / ED Course  I have reviewed the triage vital signs and the nursing notes.  Pertinent labs & imaging results that were available during my care of the patient were reviewed by me and considered in my medical decision making (see chart for details).     Pt w/ 2d intermittent non-exertional chest tightness. Well appearing, mildly anxious, BP mildly elevated. O2 sat 94-96% on RA. His EKG has inferior and lateral non-specific T wave changes with no previous EKG available for comparison. I sent D-dimer due to mildly low O2 sats and abnormal EKG, although pt has no major risk factors for clotting.    Trops negative, basic labs reassuring. D-dimer negative making PE very unlikely.  Given no associated symptoms, no exertional symptoms, I feel he is safe for discharge as his HEART score is 2.  I have recommended that he establish care with a PCP and provided him with community health and wellness information for recheck of his blood pressure and reassessment of his symptoms.  Reviewed return precautions and patient discharged in satisfactory  condition.  Final Clinical Impressions(s) / ED Diagnoses   Final diagnoses:  Atypical chest pain    ED Discharge Orders    None       Lenzie Sandler, Ambrose Finlandachel Morgan, MD 03/07/17 1332

## 2017-03-07 NOTE — ED Notes (Signed)
Lab called and will add on dimer.

## 2018-06-16 ENCOUNTER — Emergency Department (HOSPITAL_COMMUNITY)
Admission: EM | Admit: 2018-06-16 | Discharge: 2018-06-17 | Disposition: A | Discharge status: Home / Self Care | Payer: 59 | Attending: Emergency Medicine | Admitting: Emergency Medicine

## 2018-06-16 ENCOUNTER — Encounter (HOSPITAL_COMMUNITY): Payer: Self-pay

## 2018-06-16 ENCOUNTER — Other Ambulatory Visit: Payer: Self-pay

## 2018-06-16 DIAGNOSIS — R109 Unspecified abdominal pain: Secondary | ICD-10-CM | POA: Insufficient documentation

## 2018-06-16 DIAGNOSIS — Z5321 Procedure and treatment not carried out due to patient leaving prior to being seen by health care provider: Secondary | ICD-10-CM | POA: Insufficient documentation

## 2018-06-16 LAB — URINALYSIS, ROUTINE W REFLEX MICROSCOPIC
Bacteria, UA: NONE SEEN
Bilirubin Urine: NEGATIVE
Glucose, UA: NEGATIVE mg/dL
KETONES UR: 5 mg/dL — AB
Leukocytes,Ua: NEGATIVE
Nitrite: NEGATIVE
PH: 5 (ref 5.0–8.0)
PROTEIN: 30 mg/dL — AB
Specific Gravity, Urine: 1.025 (ref 1.005–1.030)

## 2018-06-16 LAB — CBC
HCT: 46.4 % (ref 39.0–52.0)
Hemoglobin: 15.6 g/dL (ref 13.0–17.0)
MCH: 28.6 pg (ref 26.0–34.0)
MCHC: 33.6 g/dL (ref 30.0–36.0)
MCV: 85.1 fL (ref 80.0–100.0)
PLATELETS: 300 10*3/uL (ref 150–400)
RBC: 5.45 MIL/uL (ref 4.22–5.81)
RDW: 12.7 % (ref 11.5–15.5)
WBC: 11.2 10*3/uL — ABNORMAL HIGH (ref 4.0–10.5)
nRBC: 0 % (ref 0.0–0.2)

## 2018-06-16 LAB — COMPREHENSIVE METABOLIC PANEL
ALBUMIN: 4.8 g/dL (ref 3.5–5.0)
ALK PHOS: 71 U/L (ref 38–126)
ALT: 31 U/L (ref 0–44)
AST: 27 U/L (ref 15–41)
Anion gap: 13 (ref 5–15)
BUN: 13 mg/dL (ref 6–20)
CALCIUM: 9.5 mg/dL (ref 8.9–10.3)
CO2: 24 mmol/L (ref 22–32)
CREATININE: 1.02 mg/dL (ref 0.61–1.24)
Chloride: 103 mmol/L (ref 98–111)
GFR calc Af Amer: >60 mL/min (ref >60)
GFR calc non Af Amer: >60 mL/min (ref >60)
GLUCOSE: 108 mg/dL — AB (ref 70–99)
Potassium: 3.6 mmol/L (ref 3.5–5.1)
SODIUM: 140 mmol/L (ref 135–145)
Total Bilirubin: 0.9 mg/dL (ref 0.3–1.2)
Total Protein: 8.2 g/dL — ABNORMAL HIGH (ref 6.5–8.1)

## 2018-06-16 LAB — LIPASE, BLOOD: Lipase: 22 U/L (ref 11–51)

## 2018-06-16 NOTE — ED Triage Notes (Signed)
Pt arrives POV for eval of generalized abd pain onset since Sunday which he believes is r/t [place] [employer]. Pt reports N/V/D. Inability to tolerate PO intake.

## 2018-06-17 NOTE — ED Notes (Signed)
Pt was called by registration earlier no reply. Called x5 no reply. Not seen in lobby.

## 2020-02-11 ENCOUNTER — Other Ambulatory Visit: Payer: Self-pay | Admitting: Physician Assistant

## 2020-02-11 DIAGNOSIS — R221 Localized swelling, mass and lump, neck: Secondary | ICD-10-CM

## 2020-03-15 ENCOUNTER — Ambulatory Visit
Admission: RE | Admit: 2020-03-15 | Discharge: 2020-03-15 | Disposition: A | Discharge status: Home / Self Care | Payer: 59 | Source: Ambulatory Visit | Attending: Physician Assistant | Admitting: Physician Assistant

## 2020-03-15 ENCOUNTER — Other Ambulatory Visit: Payer: Self-pay

## 2020-03-15 DIAGNOSIS — R221 Localized swelling, mass and lump, neck: Secondary | ICD-10-CM

## 2020-03-15 MED ORDER — IOPAMIDOL (ISOVUE-300) INJECTION 61%
75.0000 mL | Freq: Once | INTRAVENOUS | Status: AC | PRN
  Administered 2020-03-15: 75 mL via INTRAVENOUS

## 2023-08-12 ENCOUNTER — Encounter (HOSPITAL_COMMUNITY): Payer: Self-pay | Admitting: Emergency Medicine

## 2023-08-12 ENCOUNTER — Ambulatory Visit (HOSPITAL_COMMUNITY)
Admission: EM | Admit: 2023-08-12 | Discharge: 2023-08-12 | Disposition: A | Discharge status: Home / Self Care | Payer: Self-pay | Attending: Emergency Medicine | Admitting: Emergency Medicine

## 2023-08-12 DIAGNOSIS — H66013 Acute suppurative otitis media with spontaneous rupture of ear drum, bilateral: Secondary | ICD-10-CM

## 2023-08-12 DIAGNOSIS — I10 Essential (primary) hypertension: Secondary | ICD-10-CM

## 2023-08-12 MED ORDER — AMLODIPINE BESYLATE 5 MG PO TABS: 5.0000 mg | ORAL_TABLET | Freq: Every day | ORAL | 1 refills | Status: AC

## 2023-08-12 MED ORDER — AMOXICILLIN-POT CLAVULANATE 875-125 MG PO TABS: 1.0000 | ORAL_TABLET | Freq: Two times a day (BID) | ORAL | 0 refills | Status: AC

## 2023-08-12 NOTE — ED Provider Notes (Signed)
 [facility]    CSN: [id number] Arrival date & time: 08/12/23  1150      History   Chief Complaint No chief complaint on file.   HPI Ricky Huber is a 53 y.o. male.   Patient presents with bilateral ear pain and drainage x 2 weeks.  Patient states that he has noticed bloody drainage from his right ear and yellow drainage from his left ear.  Patient states while at work he has to wear earplugs and that is a very dusty environment and he occasionally will have dust in his ears.  Patient states that he uses Q-tips to clean his ears out.  Patient states that about 2 to 3 years ago he had an ear infection in his right ear and was told to follow-up with ENT Reading this, but did not have insurance at the time and could not follow-up with them.  Patient's blood pressure is also elevated in clinic today.  Patient denies any known history of high blood pressure denies taking any medication for this.  Denies headache, blurred vision, dizziness, weakness, numbness, confusion, slurred speech, chest pain, and shortness of breath.  The history is provided by the patient and medical records.    History reviewed. No pertinent past medical history.  There are no active problems to display for this patient.   Past Surgical History:  Procedure Laterality Date   THYROIDECTOMY         Home Medications    Prior to Admission medications   Medication Sig Start Date End Date Taking? Authorizing Provider  amLODipine (NORVASC) 5 MG tablet Take 1 tablet (5 mg total) by mouth daily. 08/12/23  Yes Levora Reas A, NP  amoxicillin-clavulanate (AUGMENTIN) 875-125 MG tablet Take 1 tablet by mouth every 12 (twelve) hours. 08/12/23  Yes Levora Reas A, NP  PERCOCET  5-325 MG per tablet Take 1 tablet by mouth every 6 (six) hours as needed for severe pain. Patient not taking: Reported on 11/19/2014 07/30/13   Lawyer, Veryl Gottron, PA-C  promethazine  (PHENERGAN ) 25 MG tablet Take 1 tablet (25 mg  total) by mouth every 8 (eight) hours as needed for nausea or vomiting. Patient not taking: Reported on 11/19/2014 07/30/13   Mirta Ammon, PA-C  tamsulosin  (FLOMAX ) 0.4 MG CAPS capsule Take 1 capsule (0.4 mg total) by mouth daily. Patient not taking: Reported on 11/19/2014 07/30/13   Mirta Ammon, PA-C    Family History No family history on file.  Social History Social History   Tobacco Use   Smoking status: Never   Smokeless tobacco: Never  Substance Use Topics   Alcohol use: No   Drug use: No     Allergies   Patient has no known allergies.   Review of Systems Review of Systems  Per HPI  Physical Exam Triage Vital Signs ED Triage Vitals  Encounter Vitals Group     BP 08/12/23 1318 (!) 180/122     Systolic BP Percentile --      Diastolic BP Percentile --      Pulse Rate 08/12/23 1318 73     Resp 08/12/23 1318 15     Temp 08/12/23 1318 98.3 F (36.8 C)     Temp Source 08/12/23 1318 Oral     SpO2 08/12/23 1318 97 %     Weight --      Height --      Head Circumference --      Peak Flow --      Pain Score 08/12/23 1316  8     Pain Loc --      Pain Education --      Exclude from Growth Chart --    No data found.  Updated Vital Signs BP (!) 180/122 (BP Location: Left Arm)   Pulse 73   Temp 98.3 F (36.8 C) (Oral)   Resp 15   SpO2 97%   Visual Acuity Right Eye Distance:   Left Eye Distance:   Bilateral Distance:    Right Eye Near:   Left Eye Near:    Bilateral Near:     Physical Exam Vitals and nursing note reviewed.  Constitutional:      General: He is awake. He is not in acute distress.    Appearance: Normal appearance. He is well-developed and well-groomed. He is not ill-appearing.  HENT:     Right Ear: Drainage present. Tympanic membrane is perforated.     Left Ear: Drainage present. Tympanic membrane is perforated.     Ears:     Comments: Bloody drainage noted to right ear canal.  Purulent drainage noted to left ear  canal. Musculoskeletal:        General: Normal range of motion.     Cervical back: Normal range of motion and neck supple.  Skin:    General: Skin is warm and dry.  Neurological:     General: No focal deficit present.     Mental Status: He is alert and oriented to person, place, and time. Mental status is at baseline.     GCS: GCS eye subscore is 4. GCS verbal subscore is 5. GCS motor subscore is 6.     Cranial Nerves: Cranial nerves 2-12 are intact.     Sensory: Sensation is intact.     Motor: Motor function is intact.     Coordination: Coordination is intact.     Gait: Gait is intact.  Psychiatric:        Behavior: Behavior is cooperative.      UC Treatments / Results  Labs (all labs ordered are listed, but only abnormal results are displayed) Labs Reviewed - No data to display  EKG   Radiology No results found.  Procedures Procedures (including critical care time)  Medications Ordered in UC Medications - No data to display  Initial Impression / Assessment and Plan / UC Course  I have reviewed the triage vital signs and the nursing notes.  Pertinent labs & imaging results that were available during my care of the patient were reviewed by me and considered in my medical decision making (see chart for details).     Patient is well-appearing.  Vitals are stable.  Blood pressure 180/122.  Denies history of hypertension.  Upon assessment bilateral TMs perforated.  There is bloody drainage noted to right ear canal.  Purulent drainage noted to left ear canal.  Prescribed Augmentin for otitis media.  Given ENT follow-up.  Prescribed amlodipine for blood pressure.  Given information for  community health and wellness to follow-up with regarding further management of blood pressure.  Discussed return precautions. Final Clinical Impressions(s) / UC Diagnoses   Final diagnoses:  Non-recurrent acute suppurative otitis media of both ears with spontaneous rupture of  tympanic membranes  Hypertension, unspecified type     Discharge Instructions      Start taking Augmentin twice daily for 7 days for ear infection. Follow-up with ear, nose, throat doctor for further evaluation and management of recurrent ear issues. Avoid sticking anything in your ears as  this can cause worsening trauma or infection. Start taking amlodipine once daily for your blood pressure.  Follow-up with [facility] for further evaluation and management of your blood pressure. Return here as needed.    ED Prescriptions     Medication Sig Dispense Auth. Provider   amoxicillin-clavulanate (AUGMENTIN) 875-125 MG tablet Take 1 tablet by mouth every 12 (twelve) hours. 14 tablet Rosevelt Constable, Dorothe Elmore A, NP   amLODipine (NORVASC) 5 MG tablet Take 1 tablet (5 mg total) by mouth daily. 30 tablet Levora Reas A, NP      PDMP not reviewed this encounter.   Levora Reas A, NP 08/12/23 1341

## 2023-08-12 NOTE — Discharge Instructions (Addendum)
 Start taking Augmentin twice daily for 7 days for ear infection. Follow-up with ear, nose, throat doctor for further evaluation and management of recurrent ear issues. Avoid sticking anything in your ears as this can cause worsening trauma or infection. Start taking amlodipine once daily for your blood pressure.  Follow-up with [facility] for further evaluation and management of your blood pressure. Return here as needed.

## 2023-08-12 NOTE — ED Triage Notes (Signed)
 Pt reports 2-3 years had right ear infection that caused a ball.  Pt reports at work has to wear ear plugs and its dusty in work environment. Reports has drainage and blood from bilateral ears for 2 weeks.
